# Patient Record
Sex: Female | Born: 1980 | Race: Black or African American | Hispanic: No | Marital: Married | Smoking: Never smoker
Health system: Southern US, Community
[De-identification: ages and names within clinical notes are randomized; demographics above are authoritative.]

---

## 2016-02-16 ENCOUNTER — Inpatient Hospital Stay (HOSPITAL_COMMUNITY): Payer: Managed Care, Other (non HMO)

## 2016-02-16 ENCOUNTER — Inpatient Hospital Stay (HOSPITAL_COMMUNITY)
Admission: AD | Admit: 2016-02-16 | Discharge: 2016-02-17 | Disposition: A | Discharge status: Home / Self Care | Payer: Managed Care, Other (non HMO) | Source: Ambulatory Visit | Attending: Obstetrics & Gynecology | Admitting: Obstetrics & Gynecology

## 2016-02-16 ENCOUNTER — Ambulatory Visit (HOSPITAL_COMMUNITY)
Admission: EM | Admit: 2016-02-16 | Discharge: 2016-02-16 | Disposition: A | Discharge status: Nursing Home (Includes ICF) | Payer: Managed Care, Other (non HMO) | Source: Home / Self Care | Attending: Family Medicine | Admitting: Family Medicine

## 2016-02-16 ENCOUNTER — Encounter (HOSPITAL_COMMUNITY): Payer: Self-pay | Admitting: Family Medicine

## 2016-02-16 DIAGNOSIS — R1031 Right lower quadrant pain: Secondary | ICD-10-CM

## 2016-02-16 DIAGNOSIS — R102 Pelvic and perineal pain: Secondary | ICD-10-CM | POA: Insufficient documentation

## 2016-02-16 DIAGNOSIS — R1032 Left lower quadrant pain: Secondary | ICD-10-CM | POA: Insufficient documentation

## 2016-02-16 DIAGNOSIS — R1084 Generalized abdominal pain: Secondary | ICD-10-CM | POA: Diagnosis not present

## 2016-02-16 DIAGNOSIS — R110 Nausea: Secondary | ICD-10-CM | POA: Diagnosis not present

## 2016-02-16 LAB — CBC
HEMATOCRIT: 36.1 % (ref 36.0–46.0)
HEMOGLOBIN: 12.9 g/dL (ref 12.0–15.0)
MCH: 30.9 pg (ref 26.0–34.0)
MCHC: 35.7 g/dL (ref 30.0–36.0)
MCV: 86.6 fL (ref 78.0–100.0)
Platelets: 253 10*3/uL (ref 150–400)
RBC: 4.17 MIL/uL (ref 3.87–5.11)
RDW: 13.5 % (ref 11.5–15.5)
WBC: 8.3 10*3/uL (ref 4.0–10.5)

## 2016-02-16 LAB — POCT URINALYSIS DIP (DEVICE)
BILIRUBIN URINE: NEGATIVE
GLUCOSE, UA: NEGATIVE mg/dL
KETONES UR: NEGATIVE mg/dL
Leukocytes, UA: NEGATIVE
NITRITE: NEGATIVE
Protein, ur: NEGATIVE mg/dL
Specific Gravity, Urine: 1.01 (ref 1.005–1.030)
Urobilinogen, UA: 0.2 mg/dL (ref 0.0–1.0)
pH: 7 (ref 5.0–8.0)

## 2016-02-16 LAB — POCT PREGNANCY, URINE: PREG TEST UR: NEGATIVE

## 2016-02-16 MED ORDER — KETOROLAC TROMETHAMINE 60 MG/2ML IM SOLN: 60.0000 mg | Freq: Once | INTRAMUSCULAR | Status: DC

## 2016-02-16 MED ORDER — IBUPROFEN 600 MG PO TABS: 600.0000 mg | ORAL_TABLET | Freq: Four times a day (QID) | ORAL | 1 refills | Status: AC | PRN

## 2016-02-16 MED ORDER — ONDANSETRON HCL 8 MG PO TABS: 8.0000 mg | ORAL_TABLET | Freq: Three times a day (TID) | ORAL | 0 refills | Status: AC | PRN

## 2016-02-16 NOTE — ED Triage Notes (Signed)
Pt here for lower abd pain since yesterday. sts constant. sts pain is the same on both sides and pressing in makes it better then it becomes sharp. Denies any urinary symptoms. Denies vaginal bleeding or discharge. Denies N,V,D. sts nothing makes it worse or better.

## 2016-02-16 NOTE — Discharge Instructions (Signed)
Abdominal Pain, Adult Abdominal pain can be caused by many things. Often, abdominal pain is not serious and it gets better with no treatment or by being treated at home. However, sometimes abdominal pain is serious. Your health care provider will do a medical history and a physical exam to try to determine the cause of your abdominal pain. Follow these instructions at home:  Take over-the-counter and prescription medicines only as told by your health care provider. Do not take a laxative unless told by your health care provider.  Drink enough fluid to keep your urine clear or pale yellow.  Watch your condition for any changes.  Keep all follow-up visits as told by your health care provider. This is important. Contact a health care provider if:  Your abdominal pain changes or gets worse.  You are not hungry or you lose weight without trying.  You are constipated or have diarrhea for more than 2-3 days.  You have pain when you urinate or have a bowel movement.  Your abdominal pain wakes you up at night.  Your pain gets worse with meals, after eating, or with certain foods.  You are throwing up and cannot keep anything down.  You have a fever. Get help right away if:  Your pain does not go away as soon as your health care provider told you to expect.  You cannot stop throwing up.  Your pain is only in areas of the abdomen, such as the right side or the left lower portion of the abdomen.  You have bloody or black stools, or stools that look like tar.  You have severe pain, cramping, or bloating in your abdomen.  You have signs of dehydration, such as:  Dark urine, very little urine, or no urine.  Cracked lips.  Dry mouth.  Sunken eyes.  Sleepiness.  Weakness. This information is not intended to replace advice given to you by your health care provider. Make sure you discuss any questions you have with your health care provider. Document Released: 12/08/2004 Document  Revised: 09/18/2015 Document Reviewed: 08/12/2015 Elsevier Interactive Patient Education  2017 [employer] Inc[employer]   Viral Gastroenteritis, Adult Viral gastroenteritis is also known as the stomach flu. This condition is caused by various viruses. These viruses can be passed from person to person very easily (are very contagious). This condition may affect your stomach, small intestine, and large intestine. It can cause sudden watery diarrhea, fever, and vomiting. Diarrhea and vomiting can make you feel weak and cause you to become dehydrated. You may not be able to keep fluids down. Dehydration can make you tired and thirsty, cause you to have a dry mouth, and decrease how often you urinate. Older adults and people with other diseases or a weak immune system are at higher risk for dehydration. It is important to replace the fluids that you lose from diarrhea and vomiting. If you become severely dehydrated, you may need to get fluids through an IV tube. What are the causes? Gastroenteritis is caused by various viruses, including rotavirus and norovirus. Norovirus is the most common cause in adults. You can get sick by eating food, drinking water, or touching a surface contaminated with one of these viruses. You can also get sick from sharing utensils or other personal items with an infected person. What increases the risk? This condition is more likely to develop in people:  Who have a weak defense system (immune system).  Who live with one or more children who are younger than 2  years old.  Who live in a nursing home.  Who go on cruise ships. What are the signs or symptoms? Symptoms of this condition start suddenly 1-2 days after exposure to a virus. Symptoms may last a few days or as long as a week. The most common symptoms are watery diarrhea and vomiting. Other symptoms include:  Fever.  Headache.  Fatigue.  Pain in the abdomen.  Chills.  Weakness.  Nausea.  Muscle aches.  Loss  of appetite. How is this diagnosed? This condition is diagnosed with a medical history and physical exam. You may also have a stool test to check for viruses or other infections. How is this treated? This condition typically goes away on its own. The focus of treatment is to restore lost fluids (rehydration). Your health care provider may recommend that you take an oral rehydration solution (ORS) to replace important salts and minerals (electrolytes) in your body. Severe cases of this condition may require giving fluids through an IV tube. Treatment may also include medicine to help with your symptoms. Follow these instructions at home: Follow instructions from your health care provider about how to care for yourself at home. Eating and drinking Follow these recommendations as told by your health care provider:  Take an ORS. This is a drink that is sold at pharmacies and retail stores.  Drink clear fluids in small amounts as you are able. Clear fluids include water, ice chips, diluted fruit juice, and low-calorie sports drinks.  Eat bland, easy-to-digest foods in small amounts as you are able. These foods include bananas, applesauce, rice, lean meats, toast, and crackers.  Avoid fluids that contain a lot of sugar or caffeine, such as energy drinks, sports drinks, and soda.  Avoid alcohol.  Avoid spicy or fatty foods. General instructions  Drink enough fluid to keep your urine clear or pale yellow.  Wash your hands often. If soap and water are not available, use hand sanitizer.  Make sure that all people in your household wash their hands well and often.  Take over-the-counter and prescription medicines only as told by your health care provider.  Rest at home while you recover.  Watch your condition for any changes.  Take a warm bath to relieve any burning or pain from frequent diarrhea episodes.  Keep all follow-up visits as told by your health care provider. This is  important. Contact a health care provider if:  You cannot keep fluids down.  Your symptoms get worse.  You have new symptoms.  You feel light-headed or dizzy.  You have muscle cramps. Get help right away if:  You have chest pain.  You feel extremely weak or you faint.  You see blood in your vomit.  Your vomit looks like coffee grounds.  You have bloody or black stools or stools that look like tar.  You have a severe headache, a stiff neck, or both.  You have a rash.  You have severe pain, cramping, or bloating in your abdomen.  You have trouble breathing or you are breathing very quickly.  Your heart is beating very quickly.  Your skin feels cold and clammy.  You feel confused.  You have pain when you urinate.  You have signs of dehydration, such as:  Dark urine, very little urine, or no urine.  Cracked lips.  Dry mouth.  Sunken eyes.  Sleepiness.  Weakness. This information is not intended to replace advice given to you by your health care provider. Make sure you discuss any  questions you have with your health care provider. Document Released: 02/28/2005 Document Revised: 08/12/2015 Document Reviewed: 11/04/2014 Elsevier Interactive Patient Education  2017 [employer]

## 2016-02-16 NOTE — Discharge Instructions (Signed)
[facility]

## 2016-02-16 NOTE — ED Provider Notes (Signed)
CSN: [id number]     Arrival date & time 02/16/16  1655 History   First MD Initiated Contact with Patient 02/16/16 1738     Chief Complaint  Patient presents with  . Abdominal Pain   (Consider location/radiation/quality/duration/timing/severity/associated sxs/prior Treatment) Patient c/o lower abdominal pain x 1 day.  She states the pain is severe.  She c/o nause with the pain on occasion. She denies fever, or vaginal dc.  She has hx of  Endometriosis.   The history is provided by the patient.  Abdominal Pain  Pain location:  LLQ, RLQ and suprapubic Pain quality: aching   Pain radiates to:  Suprapubic region Pain severity:  Severe Onset quality:  Sudden Duration:  1 day Timing:  Constant Progression:  Worsening Chronicity:  New Relieved by:  Nothing Worsened by:  Nothing   History reviewed. No pertinent past medical history. History reviewed. No pertinent surgical history. History reviewed. No pertinent family history. Social History  Substance Use Topics  . Smoking status: Never Smoker  . Smokeless tobacco: Never Used  . Alcohol use Not on file   OB History    No data available     Review of Systems  Constitutional: Negative.   HENT: Negative.   Eyes: Negative.   Respiratory: Negative.   Cardiovascular: Negative.   Gastrointestinal: Positive for abdominal pain.  Endocrine: Negative.   Genitourinary: Positive for pelvic pain.  Musculoskeletal: Negative.   Skin: Negative.   Allergic/Immunologic: Negative.   Neurological: Negative.   Hematological: Negative.   Psychiatric/Behavioral: Negative.     Allergies  Patient has no known allergies.  Home Medications   Prior to Admission medications   Not on File   Meds Ordered and Administered this Visit  Medications - No data to display  BP 110/70 (BP Location: Right Arm)   Pulse 94   Temp 98.8 F (37.1 C) (Oral)   Resp 14   LMP 02/07/2016   SpO2 100%  No data found.   Physical Exam  Constitutional:  She is oriented to person, place, and time. She appears well-developed and well-nourished.  HENT:  Head: Normocephalic and atraumatic.  Eyes: EOM are normal. Pupils are equal, round, and reactive to light.  Neck: Normal range of motion. Neck supple.  Cardiovascular: Normal rate, regular rhythm and normal heart sounds.   Pulmonary/Chest: Effort normal and breath sounds normal.  Abdominal: Soft. There is tenderness.  Genitourinary:  Genitourinary Comments: Left adenexal tenderness.  Neurological: She is alert and oriented to person, place, and time.  Nursing note and vitals reviewed.   Urgent Care Course   Clinical Course     Procedures (including critical care time)  Labs Review Labs Reviewed  POCT URINALYSIS DIP (DEVICE) - Abnormal; Notable for the following:       Result Value   Hgb urine dipstick TRACE (*)    All other components within normal limits    Imaging Review No results found.   Visual Acuity Review  Right Eye Distance:   Left Eye Distance:   Bilateral Distance:    Right Eye Near:   Left Eye Near:    Bilateral Near:         MDM   Pelvic pain  [facility].  Talked to Midwife and report given.  [facility]  Endocervical cytology GC/ Chlamydia wet prep obtained.   Deatra CanterWilliam J Oxford, FNP 02/16/16 1911

## 2016-02-16 NOTE — MAU Provider Note (Signed)
Chief Complaint: No chief complaint on file.   First Provider Initiated Contact with Patient 02/16/16 2337     SUBJECTIVE HPI: Cassandra Spence is a 35 y.o. non-pregnant female who presents to [facility] reporting low abd pain and nausea since yesterday afternoon. Was seen at Urgent Care this afternoon. Pelvic exam, wet prep, GC/Chlamydia Cultures, UA, UPT done. Sent to MAU for further eval.   [facility] [place].   Location: Abd, greater bilat low abd Quality: sharp, shooting Severity: 10/10 on pain scale Duration: ~36 hours Context: None Timing: constant Modifying factors: Resolves w/ Ibuprofen. Has not taken Ibuprofen today Associated signs and symptoms: Pos for nausea. Neg for fever, chills, vomiting, diarrhea, constipation, VB, vaginal discharge, sick contact or urinary complaints.   Denies risk for STDs.   No past medical history on file. OB History  No data available   No past surgical history on file. Social History   Social History  . Marital status: Married    Spouse name: N/A  . Number of children: N/A  . Years of education: N/A   Occupational History  . Not on file.   Social History Main Topics  . Smoking status: Never Smoker  . Smokeless tobacco: Never Used  . Alcohol use Not on file  . Drug use: Unknown  . Sexual activity: Not on file   Other Topics Concern  . Not on file   Social History Narrative  . No narrative on file   No family history on file. No current facility-administered medications on file prior to encounter.    No current outpatient prescriptions on file prior to encounter.   No Known Allergies  I have reviewed patient's Past Medical Hx, Surgical Hx, Family Hx, Social Hx, medications and allergies.   Review of Systems  Constitutional: Negative for appetite change, chills and fever.  Gastrointestinal: Positive for abdominal pain and nausea. Negative for abdominal distention, blood in stool,  constipation, diarrhea and vomiting.  Genitourinary: Negative for dysuria, flank pain, frequency, hematuria, menstrual problem, urgency, vaginal bleeding, vaginal discharge and vaginal pain.  Musculoskeletal: Negative for back pain.    OBJECTIVE Patient Vitals for the past 24 hrs:  BP Temp Temp src Pulse Resp Weight  02/16/16 1853 118/68 98.1 F (36.7 C) Oral 86 18 194 lb (88 kg)   Constitutional: Well-developed, well-nourished female in no acute distress.  Cardiovascular: normal rate Respiratory: normal rate and effort.  GI: Abd soft, mild-mod TTP entire abd, Neg guarding or rebound tenderness. Pos BS x 4 Neurologic: Alert and oriented x 4.  GU: Neg CVAT.  SPECULUM EXAM: Deferred 2/2 recent exam. Pt declined repeat.   LAB RESULTS Results for orders placed or performed during the hospital encounter of 02/16/16 (from the past 24 hour(s))  CBC     Status: None   Collection Time: 02/16/16  8:39 PM  Result Value Ref Range   WBC 8.3 4.0 - 10.5 K/uL   RBC 4.17 3.87 - 5.11 MIL/uL   Hemoglobin 12.9 12.0 - 15.0 g/dL   HCT 16.136.1 09.636.0 - 04.546.0 %   MCV 86.6 78.0 - 100.0 fL   MCH 30.9 26.0 - 34.0 pg   MCHC 35.7 30.0 - 36.0 g/dL   RDW 40.913.5 81.111.5 - 91.415.5 %   Platelets 253 150 - 400 K/uL    IMAGING [place] Transvaginal Non-ob  Result Date: 02/16/2016 CLINICAL DATA:  Acute lower abdominal/pelvic pain for 2 days. EXAM: TRANSABDOMINAL AND TRANSVAGINAL ULTRASOUND OF PELVIS TECHNIQUE: Both transabdominal  and transvaginal ultrasound examinations of the pelvis were performed. Transabdominal technique was performed for global imaging of the pelvis including uterus, ovaries, adnexal regions, and pelvic cul-de-sac. It was necessary to proceed with endovaginal exam following the transabdominal exam to visualize the uterus, endometrium, ovaries and adnexa. COMPARISON:  None FINDINGS: Uterus Measurements: 9.1 x 4.6 x 5.1 cm. No fibroids or other mass visualized. Endometrium Thickness: 12 mm. Minimal fluid in the  endometrial canal. No focal abnormality visualized. Right ovary Measurements: 2.6 x 1.3 x 2.4 cm. Normal appearance/no adnexal mass. Patient with significant tenderness on probe pressure limiting assessment for blood flow. Left ovary Measurements: 2.7 x 2.3 x 2.6 cm. Normal appearance/no adnexal mass. Normal blood flow is seen. Other findings No abnormal free fluid. IMPRESSION: 1. Minimal fluid in the endometrial canal, likely normal. Nor abnormal endometrial thickening. 2. Normal sonographic appearance of both ovaries. Patient was tender to probe pressure over the right ovary limiting assessment for Doppler flow, no secondary findings to suggest ovarian torsion. The ovary is normal in size and position. Electronically Signed   By: Rubye OaksMelanie  Ehinger M.D.   On: 02/16/2016 23:23   [place] Pelvis Complete  Result Date: 02/16/2016 CLINICAL DATA:  Acute lower abdominal/pelvic pain for 2 days. EXAM: TRANSABDOMINAL AND TRANSVAGINAL ULTRASOUND OF PELVIS TECHNIQUE: Both transabdominal and transvaginal ultrasound examinations of the pelvis were performed. Transabdominal technique was performed for global imaging of the pelvis including uterus, ovaries, adnexal regions, and pelvic cul-de-sac. It was necessary to proceed with endovaginal exam following the transabdominal exam to visualize the uterus, endometrium, ovaries and adnexa. COMPARISON:  None FINDINGS: Uterus Measurements: 9.1 x 4.6 x 5.1 cm. No fibroids or other mass visualized. Endometrium Thickness: 12 mm. Minimal fluid in the endometrial canal. No focal abnormality visualized. Right ovary Measurements: 2.6 x 1.3 x 2.4 cm. Normal appearance/no adnexal mass. Patient with significant tenderness on probe pressure limiting assessment for blood flow. Left ovary Measurements: 2.7 x 2.3 x 2.6 cm. Normal appearance/no adnexal mass. Normal blood flow is seen. Other findings No abnormal free fluid. IMPRESSION: 1. Minimal fluid in the endometrial canal, likely normal. Nor  abnormal endometrial thickening. 2. Normal sonographic appearance of both ovaries. Patient was tender to probe pressure over the right ovary limiting assessment for Doppler flow, no secondary findings to suggest ovarian torsion. The ovary is normal in size and position. Electronically Signed   By: Rubye OaksMelanie  Ehinger M.D.   On: 02/16/2016 23:23    MAU COURSE Orders Placed This Encounter  Procedures  . US Pelvis Complete  . US Transvaginal Non-OB  . CBC   Declined meds for pain or nausea.   Discussed Hx, exam, labs, US w/ Dr. Macon LargeAnyanwu (Pt registered under wrong attending). Agrees w/ POC. No new orders. OK for D/C.   MDM - Generalized abd pain and nausea likely 2/2 gastroenteritis. No non-toxic-appearing, afebrile and w/out leokocytosis. No evidence of emergent condition, specifically low suspicion for appendicitis.   ASSESSMENT 1. Generalized abdominal pain   2. Acute pelvic pain, female   3. Nausea without vomiting     PLAN Discharge home in stable condition per consult w./ Dr. Macon LargeAnyanwu. Abd pain precautions. Appendicitis precautions.  Follow-up Information    Primary Care Provider Follow up.   Why:  as needed if symptoms do not improve in 2-3 days       [facility] Follow up.   Specialty:  Emergency Medicine Why:  as needed in emergency Contact information: [address] [id number] [place] [address] [phone]           Medication List    TAKE these medications   ibuprofen 600 MG tablet Commonly known as:  ADVIL,MOTRIN Take 1 tablet (600 mg total) by mouth every 6 (six) hours as needed for moderate pain.   ondansetron 8 MG tablet Commonly known as:  ZOFRAN Take 1 tablet (8 mg total) by mouth every 8 (eight) hours as needed for nausea or vomiting.      [place], CNM 02/16/2016  11:54 PM

## 2016-02-16 NOTE — MAU Note (Signed)
Pain in lower abd, started yesterday - continues. Hx of endometriosis, this is not the same pain.  [facility], was instructed to come here. Nausea. Denies constipation or diarrhea, no urinary symptoms.

## 2016-02-17 LAB — CERVICOVAGINAL ANCILLARY ONLY
Chlamydia: NEGATIVE
Neisseria Gonorrhea: NEGATIVE
Wet Prep (BD Affirm): POSITIVE — AB

## 2016-02-22 ENCOUNTER — Telehealth (HOSPITAL_COMMUNITY): Payer: Self-pay | Admitting: Emergency Medicine

## 2016-02-22 NOTE — Telephone Encounter (Signed)
-----   Message from Eustace MooreLaura W Murray, MD sent at 02/19/2016  1:31 PM EST ----- Patient with abd/pelvic pain, seen at MAU after discharge from UC on 02/16/16.  Pelvic ultrasound negative for pathology; dx'ed with probable gastroenteritis. Clinical staff, please let patient know that test for gardnerella was positive.  This only needs to be treated if symptoms of vaginal irritation/discharge are present.   If patient is having symptoms of vag irritation/discharge, ok to send rx for metronidazole 500mg  bid x 7d #14 no refills.  Recheck for further evaluation as needed.  LM

## 2016-02-22 NOTE — Telephone Encounter (Signed)
Called pt and notified of recent lab results from visit 02/16/16 Pt ID'd properly... Reports feeling better and sx have subsided Denies vag irritation/dc and states she does not require any tx at the moment.  States she does not agreen w/dx given at MAU Reports she has spoken w/GYN and will f/u with them.  Adv pt if sx are not getting better to return or to f/u w/GYN Pt verb understanding.

## 2017-11-14 DIAGNOSIS — J019 Acute sinusitis, unspecified: Secondary | ICD-10-CM | POA: Diagnosis not present

## 2017-11-14 DIAGNOSIS — Z6826 Body mass index (BMI) 26.0-26.9, adult: Secondary | ICD-10-CM | POA: Diagnosis not present

## 2017-11-14 DIAGNOSIS — B9689 Other specified bacterial agents as the cause of diseases classified elsewhere: Secondary | ICD-10-CM | POA: Diagnosis not present

## 2018-01-18 DIAGNOSIS — R82998 Other abnormal findings in urine: Secondary | ICD-10-CM | POA: Diagnosis not present

## 2018-01-18 DIAGNOSIS — Z6828 Body mass index (BMI) 28.0-28.9, adult: Secondary | ICD-10-CM | POA: Diagnosis not present

## 2018-05-17 IMAGING — US US TRANSVAGINAL NON-OB
1 series · 15 of 25 positions shown · non-contrast
Comparison: None

CLINICAL DATA: Acute lower abdominal/pelvic pain for 2 days.



[Series 1: us transvaginal non-ob · 15 of 59 slices shown]
[im 1/59]
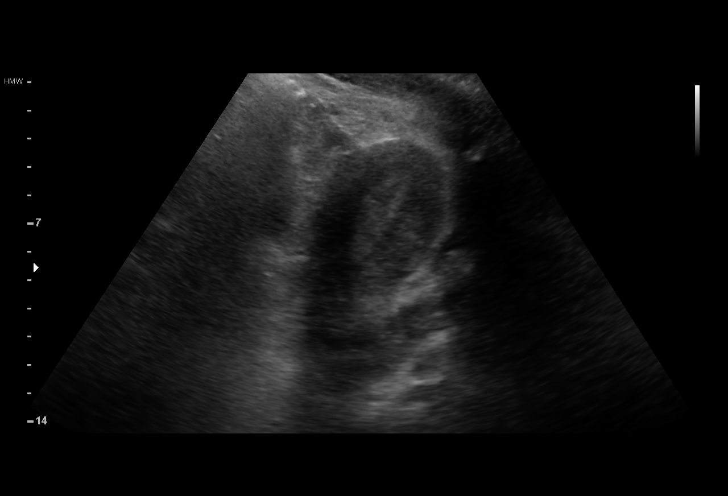
[im 5/59]
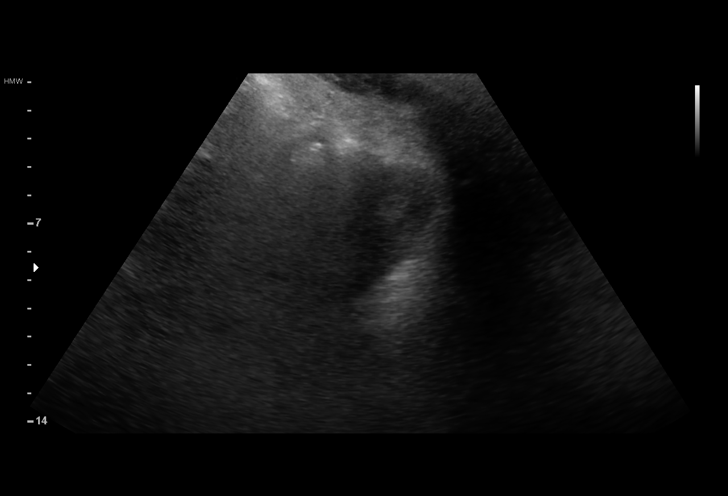
[im 10/59]
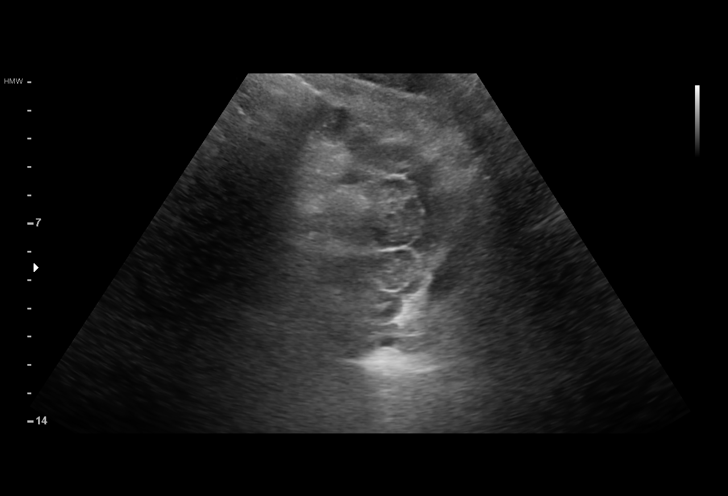
[im 13/59]
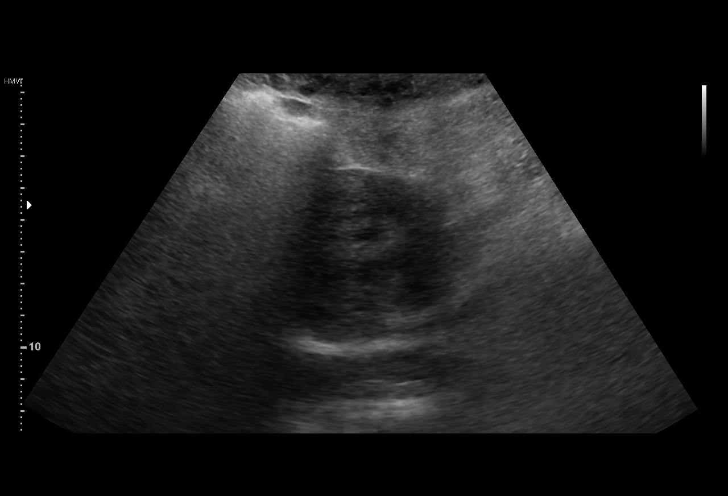
[im 17/59]
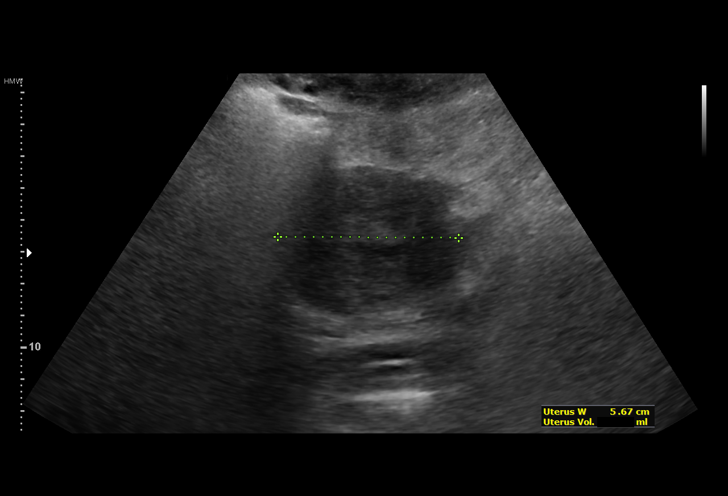
[im 22/59]
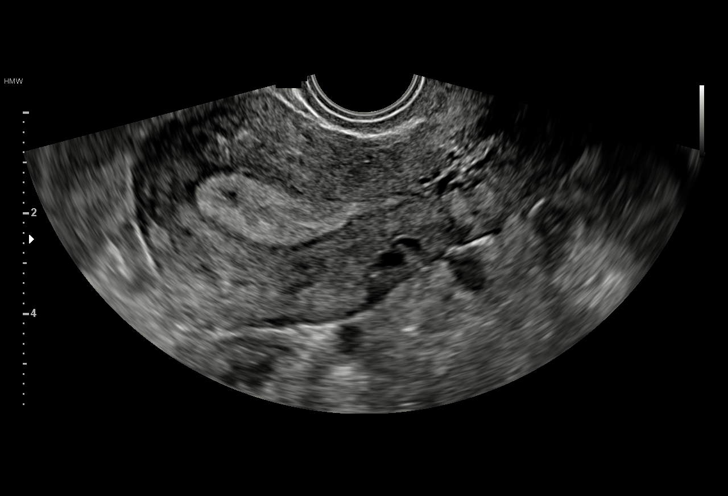
[im 25/59]
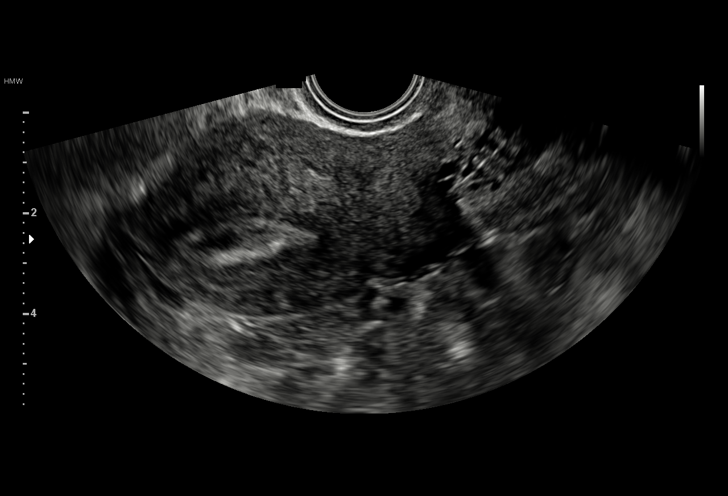
[im 30/59]
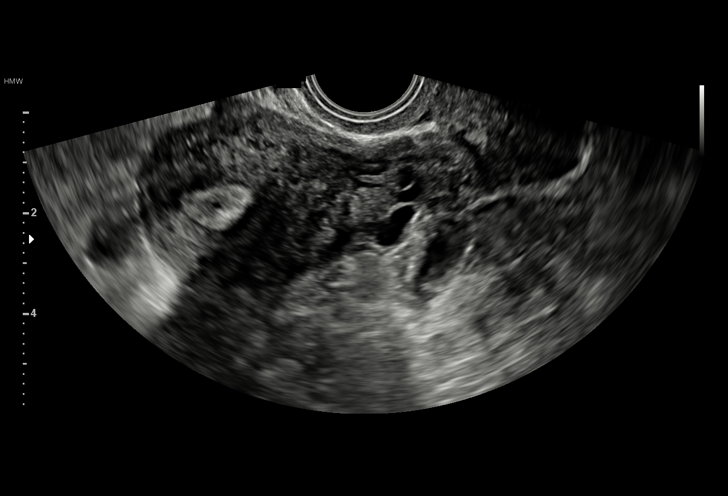
[im 34/59]
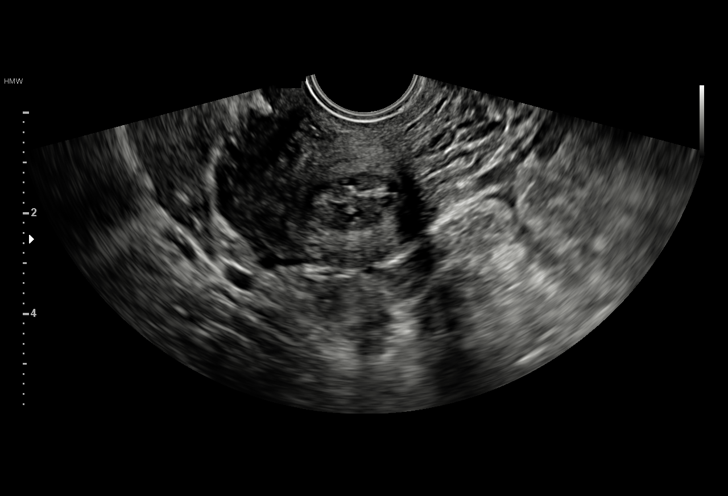
[im 37/59]
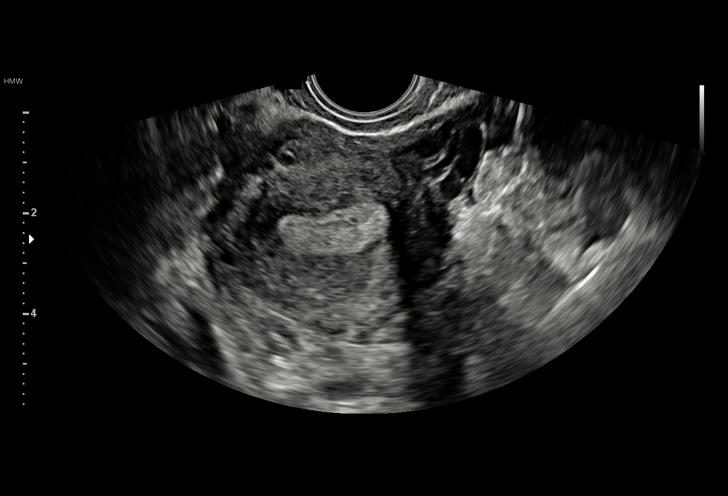
[im 42/59]
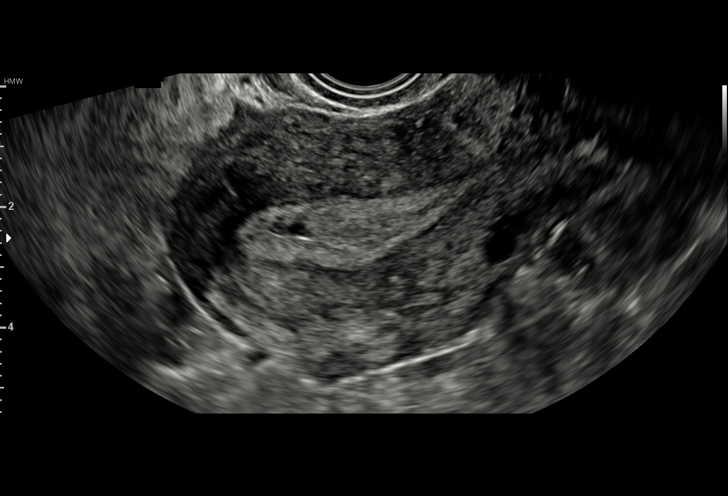
[im 46/59]
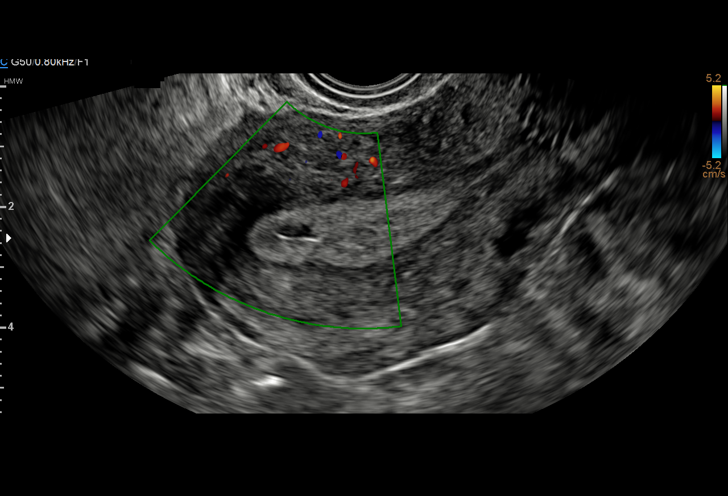
[im 49/59]
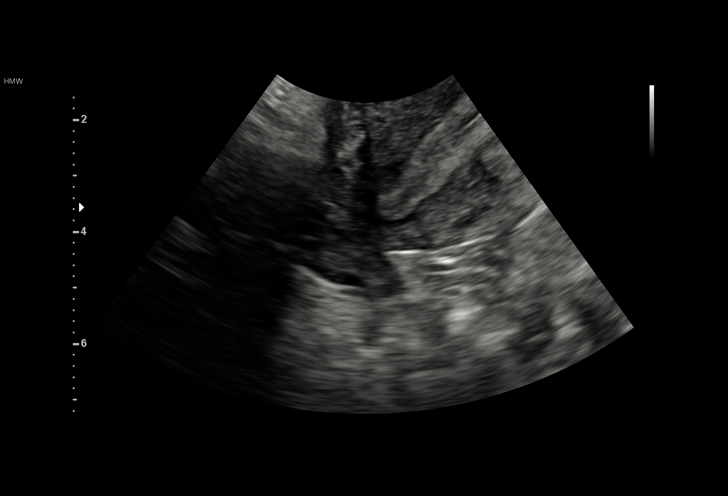
[im 54/59]
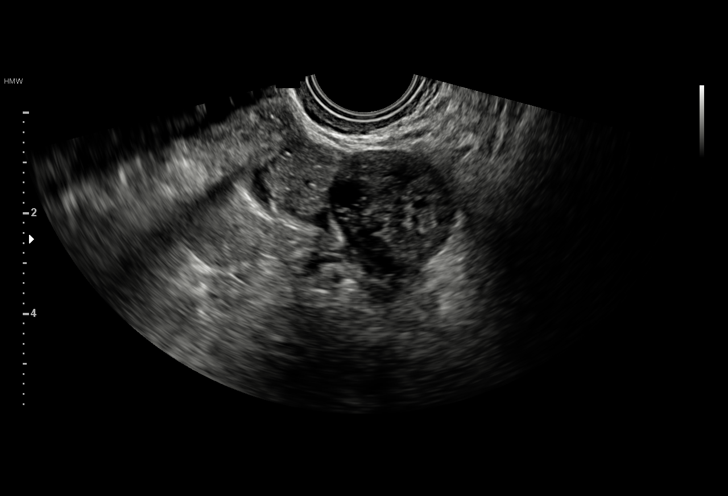
[im 59/59]
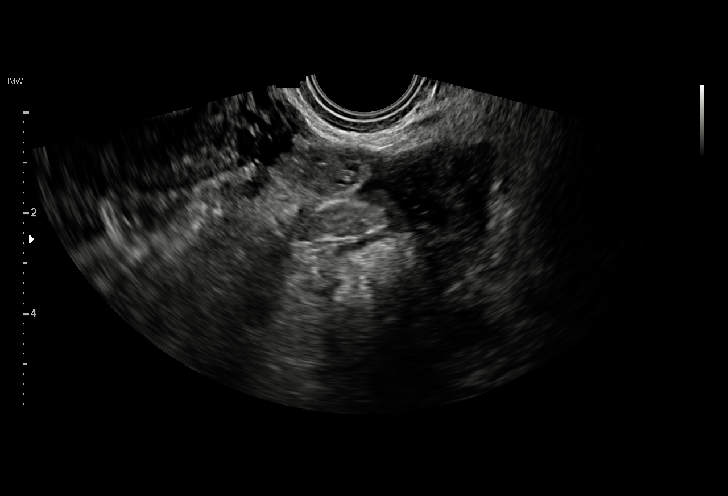

[15 of 25 positions shown; findings below may reference images not displayed]

FINDINGS: Uterus

Measurements: 9.1 x 4.6 x 5.1 cm. No fibroids or other mass
visualized.

Endometrium

Thickness: 12 mm. Minimal fluid in the endometrial canal. No focal
abnormality visualized.

Right ovary

Measurements: 2.6 x 1.3 x 2.4 cm. Normal appearance/no adnexal mass.
Patient with significant tenderness on probe pressure limiting
assessment for blood flow.

Left ovary

Measurements: 2.7 x 2.3 x 2.6 cm. Normal appearance/no adnexal mass.
Normal blood flow is seen.

Other findings

No abnormal free fluid.
IMPRESSION: 1. Minimal fluid in the endometrial canal, likely normal. Nor
abnormal endometrial thickening.
2. Normal sonographic appearance of both ovaries. Patient was tender
to probe pressure over the right ovary limiting assessment for
Doppler flow, no secondary findings to suggest ovarian torsion. The
ovary is normal in size and position.

## 2018-05-28 DIAGNOSIS — R109 Unspecified abdominal pain: Secondary | ICD-10-CM | POA: Diagnosis not present

## 2018-05-28 DIAGNOSIS — Z6828 Body mass index (BMI) 28.0-28.9, adult: Secondary | ICD-10-CM | POA: Diagnosis not present

## 2018-05-29 DIAGNOSIS — M9905 Segmental and somatic dysfunction of pelvic region: Secondary | ICD-10-CM | POA: Diagnosis not present

## 2018-05-29 DIAGNOSIS — S336XXA Sprain of sacroiliac joint, initial encounter: Secondary | ICD-10-CM | POA: Diagnosis not present

## 2018-05-29 DIAGNOSIS — M9903 Segmental and somatic dysfunction of lumbar region: Secondary | ICD-10-CM | POA: Diagnosis not present

## 2018-05-29 DIAGNOSIS — M5387 Other specified dorsopathies, lumbosacral region: Secondary | ICD-10-CM | POA: Diagnosis not present

## 2018-05-31 DIAGNOSIS — M9903 Segmental and somatic dysfunction of lumbar region: Secondary | ICD-10-CM | POA: Diagnosis not present

## 2018-05-31 DIAGNOSIS — S336XXA Sprain of sacroiliac joint, initial encounter: Secondary | ICD-10-CM | POA: Diagnosis not present

## 2018-05-31 DIAGNOSIS — M5387 Other specified dorsopathies, lumbosacral region: Secondary | ICD-10-CM | POA: Diagnosis not present

## 2018-05-31 DIAGNOSIS — M9905 Segmental and somatic dysfunction of pelvic region: Secondary | ICD-10-CM | POA: Diagnosis not present

## 2018-06-04 DIAGNOSIS — S336XXA Sprain of sacroiliac joint, initial encounter: Secondary | ICD-10-CM | POA: Diagnosis not present

## 2018-06-04 DIAGNOSIS — M9903 Segmental and somatic dysfunction of lumbar region: Secondary | ICD-10-CM | POA: Diagnosis not present

## 2018-06-04 DIAGNOSIS — M9905 Segmental and somatic dysfunction of pelvic region: Secondary | ICD-10-CM | POA: Diagnosis not present

## 2018-06-04 DIAGNOSIS — M5387 Other specified dorsopathies, lumbosacral region: Secondary | ICD-10-CM | POA: Diagnosis not present

## 2018-06-06 DIAGNOSIS — M5387 Other specified dorsopathies, lumbosacral region: Secondary | ICD-10-CM | POA: Diagnosis not present

## 2018-06-06 DIAGNOSIS — M9903 Segmental and somatic dysfunction of lumbar region: Secondary | ICD-10-CM | POA: Diagnosis not present

## 2018-06-06 DIAGNOSIS — S336XXA Sprain of sacroiliac joint, initial encounter: Secondary | ICD-10-CM | POA: Diagnosis not present

## 2018-06-06 DIAGNOSIS — M9905 Segmental and somatic dysfunction of pelvic region: Secondary | ICD-10-CM | POA: Diagnosis not present

## 2018-06-12 DIAGNOSIS — M9905 Segmental and somatic dysfunction of pelvic region: Secondary | ICD-10-CM | POA: Diagnosis not present

## 2018-06-12 DIAGNOSIS — M9903 Segmental and somatic dysfunction of lumbar region: Secondary | ICD-10-CM | POA: Diagnosis not present

## 2018-06-12 DIAGNOSIS — M5387 Other specified dorsopathies, lumbosacral region: Secondary | ICD-10-CM | POA: Diagnosis not present

## 2018-06-12 DIAGNOSIS — S336XXA Sprain of sacroiliac joint, initial encounter: Secondary | ICD-10-CM | POA: Diagnosis not present

## 2018-06-18 DIAGNOSIS — M9905 Segmental and somatic dysfunction of pelvic region: Secondary | ICD-10-CM | POA: Diagnosis not present

## 2018-06-18 DIAGNOSIS — M9903 Segmental and somatic dysfunction of lumbar region: Secondary | ICD-10-CM | POA: Diagnosis not present

## 2018-06-18 DIAGNOSIS — S336XXA Sprain of sacroiliac joint, initial encounter: Secondary | ICD-10-CM | POA: Diagnosis not present

## 2018-06-18 DIAGNOSIS — M5387 Other specified dorsopathies, lumbosacral region: Secondary | ICD-10-CM | POA: Diagnosis not present

## 2018-07-03 DIAGNOSIS — M9905 Segmental and somatic dysfunction of pelvic region: Secondary | ICD-10-CM | POA: Diagnosis not present

## 2018-07-03 DIAGNOSIS — S336XXA Sprain of sacroiliac joint, initial encounter: Secondary | ICD-10-CM | POA: Diagnosis not present

## 2018-07-03 DIAGNOSIS — M9903 Segmental and somatic dysfunction of lumbar region: Secondary | ICD-10-CM | POA: Diagnosis not present

## 2018-07-03 DIAGNOSIS — M5387 Other specified dorsopathies, lumbosacral region: Secondary | ICD-10-CM | POA: Diagnosis not present

## 2018-07-12 DIAGNOSIS — M5387 Other specified dorsopathies, lumbosacral region: Secondary | ICD-10-CM | POA: Diagnosis not present

## 2018-07-12 DIAGNOSIS — M9905 Segmental and somatic dysfunction of pelvic region: Secondary | ICD-10-CM | POA: Diagnosis not present

## 2018-07-12 DIAGNOSIS — M9903 Segmental and somatic dysfunction of lumbar region: Secondary | ICD-10-CM | POA: Diagnosis not present

## 2018-07-12 DIAGNOSIS — S336XXA Sprain of sacroiliac joint, initial encounter: Secondary | ICD-10-CM | POA: Diagnosis not present

## 2018-07-30 DIAGNOSIS — M9905 Segmental and somatic dysfunction of pelvic region: Secondary | ICD-10-CM | POA: Diagnosis not present

## 2018-07-30 DIAGNOSIS — S336XXA Sprain of sacroiliac joint, initial encounter: Secondary | ICD-10-CM | POA: Diagnosis not present

## 2018-07-30 DIAGNOSIS — M9903 Segmental and somatic dysfunction of lumbar region: Secondary | ICD-10-CM | POA: Diagnosis not present

## 2018-07-30 DIAGNOSIS — M5387 Other specified dorsopathies, lumbosacral region: Secondary | ICD-10-CM | POA: Diagnosis not present

## 2018-08-14 DIAGNOSIS — M5387 Other specified dorsopathies, lumbosacral region: Secondary | ICD-10-CM | POA: Diagnosis not present

## 2018-08-14 DIAGNOSIS — S336XXA Sprain of sacroiliac joint, initial encounter: Secondary | ICD-10-CM | POA: Diagnosis not present

## 2018-08-14 DIAGNOSIS — M9903 Segmental and somatic dysfunction of lumbar region: Secondary | ICD-10-CM | POA: Diagnosis not present

## 2018-08-14 DIAGNOSIS — M9905 Segmental and somatic dysfunction of pelvic region: Secondary | ICD-10-CM | POA: Diagnosis not present

## 2018-09-13 DIAGNOSIS — M5387 Other specified dorsopathies, lumbosacral region: Secondary | ICD-10-CM | POA: Diagnosis not present

## 2018-09-13 DIAGNOSIS — M9903 Segmental and somatic dysfunction of lumbar region: Secondary | ICD-10-CM | POA: Diagnosis not present

## 2018-09-13 DIAGNOSIS — M9905 Segmental and somatic dysfunction of pelvic region: Secondary | ICD-10-CM | POA: Diagnosis not present

## 2018-09-13 DIAGNOSIS — S336XXA Sprain of sacroiliac joint, initial encounter: Secondary | ICD-10-CM | POA: Diagnosis not present

## 2018-09-24 DIAGNOSIS — Z20828 Contact with and (suspected) exposure to other viral communicable diseases: Secondary | ICD-10-CM | POA: Diagnosis not present

## 2018-10-11 DIAGNOSIS — K921 Melena: Secondary | ICD-10-CM | POA: Diagnosis not present

## 2018-10-11 DIAGNOSIS — R197 Diarrhea, unspecified: Secondary | ICD-10-CM | POA: Diagnosis not present

## 2018-12-19 DIAGNOSIS — Z23 Encounter for immunization: Secondary | ICD-10-CM | POA: Diagnosis not present

## 2019-01-02 DIAGNOSIS — Z20828 Contact with and (suspected) exposure to other viral communicable diseases: Secondary | ICD-10-CM | POA: Diagnosis not present

## 2019-01-07 DIAGNOSIS — M9901 Segmental and somatic dysfunction of cervical region: Secondary | ICD-10-CM | POA: Diagnosis not present

## 2019-01-07 DIAGNOSIS — M5383 Other specified dorsopathies, cervicothoracic region: Secondary | ICD-10-CM | POA: Diagnosis not present

## 2019-01-09 DIAGNOSIS — M9901 Segmental and somatic dysfunction of cervical region: Secondary | ICD-10-CM | POA: Diagnosis not present

## 2019-01-09 DIAGNOSIS — M5383 Other specified dorsopathies, cervicothoracic region: Secondary | ICD-10-CM | POA: Diagnosis not present

## 2019-01-17 DIAGNOSIS — M5383 Other specified dorsopathies, cervicothoracic region: Secondary | ICD-10-CM | POA: Diagnosis not present

## 2019-01-17 DIAGNOSIS — M9901 Segmental and somatic dysfunction of cervical region: Secondary | ICD-10-CM | POA: Diagnosis not present

## 2019-01-23 DIAGNOSIS — Z20828 Contact with and (suspected) exposure to other viral communicable diseases: Secondary | ICD-10-CM | POA: Diagnosis not present

## 2019-02-19 DIAGNOSIS — Z20828 Contact with and (suspected) exposure to other viral communicable diseases: Secondary | ICD-10-CM | POA: Diagnosis not present

## 2019-03-11 DIAGNOSIS — Z20828 Contact with and (suspected) exposure to other viral communicable diseases: Secondary | ICD-10-CM | POA: Diagnosis not present

## 2019-03-28 DIAGNOSIS — Z20822 Contact with and (suspected) exposure to covid-19: Secondary | ICD-10-CM | POA: Diagnosis not present

## 2019-04-03 DIAGNOSIS — Z20822 Contact with and (suspected) exposure to covid-19: Secondary | ICD-10-CM | POA: Diagnosis not present

## 2019-04-25 DIAGNOSIS — N390 Urinary tract infection, site not specified: Secondary | ICD-10-CM | POA: Diagnosis not present

## 2019-05-14 DIAGNOSIS — Z131 Encounter for screening for diabetes mellitus: Secondary | ICD-10-CM | POA: Diagnosis not present

## 2019-05-14 DIAGNOSIS — Z13220 Encounter for screening for lipoid disorders: Secondary | ICD-10-CM | POA: Diagnosis not present

## 2019-05-16 DIAGNOSIS — Z6828 Body mass index (BMI) 28.0-28.9, adult: Secondary | ICD-10-CM | POA: Diagnosis not present

## 2019-05-16 DIAGNOSIS — Z803 Family history of malignant neoplasm of breast: Secondary | ICD-10-CM | POA: Diagnosis not present

## 2019-05-16 DIAGNOSIS — E049 Nontoxic goiter, unspecified: Secondary | ICD-10-CM | POA: Diagnosis not present

## 2019-05-17 DIAGNOSIS — E049 Nontoxic goiter, unspecified: Secondary | ICD-10-CM | POA: Diagnosis not present

## 2019-05-20 DIAGNOSIS — N946 Dysmenorrhea, unspecified: Secondary | ICD-10-CM | POA: Diagnosis not present

## 2019-05-20 DIAGNOSIS — N921 Excessive and frequent menstruation with irregular cycle: Secondary | ICD-10-CM | POA: Diagnosis not present

## 2019-05-20 DIAGNOSIS — N938 Other specified abnormal uterine and vaginal bleeding: Secondary | ICD-10-CM | POA: Diagnosis not present

## 2019-05-28 DIAGNOSIS — Z1231 Encounter for screening mammogram for malignant neoplasm of breast: Secondary | ICD-10-CM | POA: Diagnosis not present

## 2019-05-28 DIAGNOSIS — E049 Nontoxic goiter, unspecified: Secondary | ICD-10-CM | POA: Diagnosis not present

## 2019-05-28 DIAGNOSIS — E041 Nontoxic single thyroid nodule: Secondary | ICD-10-CM | POA: Diagnosis not present

## 2019-06-18 DIAGNOSIS — N938 Other specified abnormal uterine and vaginal bleeding: Secondary | ICD-10-CM | POA: Diagnosis not present

## 2019-06-18 DIAGNOSIS — N921 Excessive and frequent menstruation with irregular cycle: Secondary | ICD-10-CM | POA: Diagnosis not present

## 2019-06-18 DIAGNOSIS — N946 Dysmenorrhea, unspecified: Secondary | ICD-10-CM | POA: Diagnosis not present

## 2019-09-03 DIAGNOSIS — J329 Chronic sinusitis, unspecified: Secondary | ICD-10-CM | POA: Diagnosis not present

## 2019-09-03 DIAGNOSIS — Z6828 Body mass index (BMI) 28.0-28.9, adult: Secondary | ICD-10-CM | POA: Diagnosis not present

## 2019-10-11 DIAGNOSIS — B001 Herpesviral vesicular dermatitis: Secondary | ICD-10-CM | POA: Diagnosis not present

## 2019-10-11 DIAGNOSIS — Z6828 Body mass index (BMI) 28.0-28.9, adult: Secondary | ICD-10-CM | POA: Diagnosis not present

## 2019-11-01 DIAGNOSIS — J020 Streptococcal pharyngitis: Secondary | ICD-10-CM | POA: Diagnosis not present

## 2019-11-01 DIAGNOSIS — Z20822 Contact with and (suspected) exposure to covid-19: Secondary | ICD-10-CM | POA: Diagnosis not present

## 2019-11-01 DIAGNOSIS — R05 Cough: Secondary | ICD-10-CM | POA: Diagnosis not present

## 2019-11-21 DIAGNOSIS — Z20822 Contact with and (suspected) exposure to covid-19: Secondary | ICD-10-CM | POA: Diagnosis not present

## 2019-12-04 DIAGNOSIS — Z23 Encounter for immunization: Secondary | ICD-10-CM | POA: Diagnosis not present

## 2019-12-19 DIAGNOSIS — R0789 Other chest pain: Secondary | ICD-10-CM | POA: Diagnosis not present

## 2019-12-19 DIAGNOSIS — R1110 Vomiting, unspecified: Secondary | ICD-10-CM | POA: Diagnosis not present

## 2019-12-19 DIAGNOSIS — R002 Palpitations: Secondary | ICD-10-CM | POA: Diagnosis not present

## 2019-12-20 DIAGNOSIS — R002 Palpitations: Secondary | ICD-10-CM | POA: Diagnosis not present

## 2019-12-20 DIAGNOSIS — R0789 Other chest pain: Secondary | ICD-10-CM | POA: Diagnosis not present

## 2022-03-28 DIAGNOSIS — R002 Palpitations: Secondary | ICD-10-CM | POA: Diagnosis not present

## 2022-03-31 DIAGNOSIS — R000 Tachycardia, unspecified: Secondary | ICD-10-CM | POA: Diagnosis not present

## 2022-03-31 DIAGNOSIS — Z6828 Body mass index (BMI) 28.0-28.9, adult: Secondary | ICD-10-CM | POA: Diagnosis not present

## 2022-04-18 DIAGNOSIS — I471 Supraventricular tachycardia, unspecified: Secondary | ICD-10-CM | POA: Diagnosis not present

## 2022-04-25 ENCOUNTER — Other Ambulatory Visit: Payer: Self-pay | Admitting: Family Medicine

## 2022-04-25 DIAGNOSIS — Z1231 Encounter for screening mammogram for malignant neoplasm of breast: Secondary | ICD-10-CM

## 2022-04-27 ENCOUNTER — Ambulatory Visit
Admission: RE | Admit: 2022-04-27 | Discharge: 2022-04-27 | Disposition: A | Discharge status: Home / Self Care | Payer: Self-pay | Source: Ambulatory Visit | Attending: Family Medicine | Admitting: Family Medicine

## 2022-04-27 DIAGNOSIS — Z1231 Encounter for screening mammogram for malignant neoplasm of breast: Secondary | ICD-10-CM

## 2022-04-29 DIAGNOSIS — R635 Abnormal weight gain: Secondary | ICD-10-CM | POA: Diagnosis not present

## 2022-04-29 DIAGNOSIS — Z3200 Encounter for pregnancy test, result unknown: Secondary | ICD-10-CM | POA: Diagnosis not present

## 2022-04-29 DIAGNOSIS — E559 Vitamin D deficiency, unspecified: Secondary | ICD-10-CM | POA: Diagnosis not present

## 2022-04-29 DIAGNOSIS — Z6828 Body mass index (BMI) 28.0-28.9, adult: Secondary | ICD-10-CM | POA: Diagnosis not present

## 2022-04-29 DIAGNOSIS — E785 Hyperlipidemia, unspecified: Secondary | ICD-10-CM | POA: Diagnosis not present

## 2022-04-29 DIAGNOSIS — Z79899 Other long term (current) drug therapy: Secondary | ICD-10-CM | POA: Diagnosis not present

## 2022-07-04 DIAGNOSIS — M9901 Segmental and somatic dysfunction of cervical region: Secondary | ICD-10-CM | POA: Diagnosis not present

## 2022-07-04 DIAGNOSIS — M461 Sacroiliitis, not elsewhere classified: Secondary | ICD-10-CM | POA: Diagnosis not present

## 2022-07-04 DIAGNOSIS — M5383 Other specified dorsopathies, cervicothoracic region: Secondary | ICD-10-CM | POA: Diagnosis not present

## 2022-07-04 DIAGNOSIS — M9905 Segmental and somatic dysfunction of pelvic region: Secondary | ICD-10-CM | POA: Diagnosis not present

## 2022-09-14 DIAGNOSIS — Z01419 Encounter for gynecological examination (general) (routine) without abnormal findings: Secondary | ICD-10-CM | POA: Diagnosis not present

## 2022-09-14 DIAGNOSIS — Z124 Encounter for screening for malignant neoplasm of cervix: Secondary | ICD-10-CM | POA: Diagnosis not present

## 2022-09-19 DIAGNOSIS — E785 Hyperlipidemia, unspecified: Secondary | ICD-10-CM | POA: Diagnosis not present

## 2022-09-19 DIAGNOSIS — Z79899 Other long term (current) drug therapy: Secondary | ICD-10-CM | POA: Diagnosis not present

## 2022-09-19 DIAGNOSIS — E559 Vitamin D deficiency, unspecified: Secondary | ICD-10-CM | POA: Diagnosis not present

## 2022-09-19 DIAGNOSIS — Z3200 Encounter for pregnancy test, result unknown: Secondary | ICD-10-CM | POA: Diagnosis not present

## 2022-09-19 DIAGNOSIS — R635 Abnormal weight gain: Secondary | ICD-10-CM | POA: Diagnosis not present

## 2022-09-19 DIAGNOSIS — Z6828 Body mass index (BMI) 28.0-28.9, adult: Secondary | ICD-10-CM | POA: Diagnosis not present

## 2022-09-19 DIAGNOSIS — R0602 Shortness of breath: Secondary | ICD-10-CM | POA: Diagnosis not present

## 2022-09-19 DIAGNOSIS — J018 Other acute sinusitis: Secondary | ICD-10-CM | POA: Diagnosis not present

## 2022-09-19 DIAGNOSIS — R7989 Other specified abnormal findings of blood chemistry: Secondary | ICD-10-CM | POA: Diagnosis not present

## 2022-10-06 DIAGNOSIS — H40003 Preglaucoma, unspecified, bilateral: Secondary | ICD-10-CM | POA: Diagnosis not present

## 2022-11-28 DIAGNOSIS — S0502XA Injury of conjunctiva and corneal abrasion without foreign body, left eye, initial encounter: Secondary | ICD-10-CM | POA: Diagnosis not present

## 2022-12-07 DIAGNOSIS — Z23 Encounter for immunization: Secondary | ICD-10-CM | POA: Diagnosis not present

## 2023-04-26 ENCOUNTER — Other Ambulatory Visit: Payer: Self-pay | Admitting: Family Medicine

## 2023-04-26 DIAGNOSIS — Z1231 Encounter for screening mammogram for malignant neoplasm of breast: Secondary | ICD-10-CM

## 2023-05-05 DIAGNOSIS — Z6829 Body mass index (BMI) 29.0-29.9, adult: Secondary | ICD-10-CM | POA: Diagnosis not present

## 2023-05-05 DIAGNOSIS — E559 Vitamin D deficiency, unspecified: Secondary | ICD-10-CM | POA: Diagnosis not present

## 2023-05-05 DIAGNOSIS — Z3200 Encounter for pregnancy test, result unknown: Secondary | ICD-10-CM | POA: Diagnosis not present

## 2023-05-05 DIAGNOSIS — R635 Abnormal weight gain: Secondary | ICD-10-CM | POA: Diagnosis not present

## 2023-05-05 DIAGNOSIS — E785 Hyperlipidemia, unspecified: Secondary | ICD-10-CM | POA: Diagnosis not present

## 2023-05-24 ENCOUNTER — Ambulatory Visit
Admission: RE | Admit: 2023-05-24 | Discharge: 2023-05-24 | Disposition: A | Discharge status: Home / Self Care | Payer: Managed Care, Other (non HMO) | Source: Ambulatory Visit | Attending: Family Medicine | Admitting: Family Medicine

## 2023-05-24 DIAGNOSIS — Z1231 Encounter for screening mammogram for malignant neoplasm of breast: Secondary | ICD-10-CM

## 2023-05-25 DIAGNOSIS — M5383 Other specified dorsopathies, cervicothoracic region: Secondary | ICD-10-CM | POA: Diagnosis not present

## 2023-05-25 DIAGNOSIS — M9901 Segmental and somatic dysfunction of cervical region: Secondary | ICD-10-CM | POA: Diagnosis not present

## 2023-05-25 DIAGNOSIS — M9905 Segmental and somatic dysfunction of pelvic region: Secondary | ICD-10-CM | POA: Diagnosis not present

## 2023-05-25 DIAGNOSIS — M461 Sacroiliitis, not elsewhere classified: Secondary | ICD-10-CM | POA: Diagnosis not present

## 2023-06-08 DIAGNOSIS — M461 Sacroiliitis, not elsewhere classified: Secondary | ICD-10-CM | POA: Diagnosis not present

## 2023-06-08 DIAGNOSIS — M9901 Segmental and somatic dysfunction of cervical region: Secondary | ICD-10-CM | POA: Diagnosis not present

## 2023-06-08 DIAGNOSIS — M5383 Other specified dorsopathies, cervicothoracic region: Secondary | ICD-10-CM | POA: Diagnosis not present

## 2023-06-08 DIAGNOSIS — M9905 Segmental and somatic dysfunction of pelvic region: Secondary | ICD-10-CM | POA: Diagnosis not present

## 2023-07-28 DIAGNOSIS — E538 Deficiency of other specified B group vitamins: Secondary | ICD-10-CM | POA: Diagnosis not present

## 2023-07-28 DIAGNOSIS — Z6829 Body mass index (BMI) 29.0-29.9, adult: Secondary | ICD-10-CM | POA: Diagnosis not present

## 2023-07-28 DIAGNOSIS — R5383 Other fatigue: Secondary | ICD-10-CM | POA: Diagnosis not present

## 2023-07-28 DIAGNOSIS — E559 Vitamin D deficiency, unspecified: Secondary | ICD-10-CM | POA: Diagnosis not present

## 2023-07-28 DIAGNOSIS — R635 Abnormal weight gain: Secondary | ICD-10-CM | POA: Diagnosis not present

## 2023-07-28 DIAGNOSIS — E785 Hyperlipidemia, unspecified: Secondary | ICD-10-CM | POA: Diagnosis not present

## 2023-07-28 DIAGNOSIS — Z79899 Other long term (current) drug therapy: Secondary | ICD-10-CM | POA: Diagnosis not present

## 2023-07-28 DIAGNOSIS — Z3200 Encounter for pregnancy test, result unknown: Secondary | ICD-10-CM | POA: Diagnosis not present

## 2023-08-02 DIAGNOSIS — S76319A Strain of muscle, fascia and tendon of the posterior muscle group at thigh level, unspecified thigh, initial encounter: Secondary | ICD-10-CM | POA: Diagnosis not present

## 2023-08-02 DIAGNOSIS — Z6828 Body mass index (BMI) 28.0-28.9, adult: Secondary | ICD-10-CM | POA: Diagnosis not present

## 2023-08-02 DIAGNOSIS — M25561 Pain in right knee: Secondary | ICD-10-CM | POA: Diagnosis not present

## 2023-08-09 ENCOUNTER — Other Ambulatory Visit: Payer: Self-pay | Admitting: Family Medicine

## 2023-08-09 DIAGNOSIS — Z1231 Encounter for screening mammogram for malignant neoplasm of breast: Secondary | ICD-10-CM

## 2023-10-12 DIAGNOSIS — N390 Urinary tract infection, site not specified: Secondary | ICD-10-CM | POA: Diagnosis not present

## 2023-11-30 DIAGNOSIS — Z01419 Encounter for gynecological examination (general) (routine) without abnormal findings: Secondary | ICD-10-CM | POA: Diagnosis not present

## 2023-11-30 DIAGNOSIS — R6882 Decreased libido: Secondary | ICD-10-CM | POA: Diagnosis not present

## 2023-11-30 DIAGNOSIS — N926 Irregular menstruation, unspecified: Secondary | ICD-10-CM | POA: Diagnosis not present

## 2023-11-30 DIAGNOSIS — N809 Endometriosis, unspecified: Secondary | ICD-10-CM | POA: Diagnosis not present

## 2023-11-30 DIAGNOSIS — Z13220 Encounter for screening for lipoid disorders: Secondary | ICD-10-CM | POA: Diagnosis not present

## 2023-12-13 DIAGNOSIS — Z23 Encounter for immunization: Secondary | ICD-10-CM | POA: Diagnosis not present

## 2023-12-27 DIAGNOSIS — N809 Endometriosis, unspecified: Secondary | ICD-10-CM | POA: Diagnosis not present

## 2023-12-27 DIAGNOSIS — N926 Irregular menstruation, unspecified: Secondary | ICD-10-CM | POA: Diagnosis not present

## 2023-12-27 DIAGNOSIS — Z01419 Encounter for gynecological examination (general) (routine) without abnormal findings: Secondary | ICD-10-CM | POA: Diagnosis not present

## 2024-06-19 ENCOUNTER — Inpatient Hospital Stay: Admission: RE | Admit: 2024-06-19 | Source: Ambulatory Visit
